# Patient Record
Sex: Male | Born: 1960 | Race: White | Hispanic: No | Marital: Married | Smoking: Current every day smoker
Health system: Southern US, Community
[De-identification: ages and names within clinical notes are randomized; demographics above are authoritative.]

## PROBLEM LIST (undated history)

## (undated) ENCOUNTER — Ambulatory Visit: Admission: EM

## (undated) DIAGNOSIS — J449 Chronic obstructive pulmonary disease, unspecified: Secondary | ICD-10-CM

---

## 2004-04-29 ENCOUNTER — Emergency Department: Payer: Self-pay | Admitting: Emergency Medicine

## 2004-07-11 ENCOUNTER — Emergency Department: Payer: Self-pay | Admitting: Emergency Medicine

## 2008-05-28 ENCOUNTER — Emergency Department: Payer: Self-pay | Admitting: Emergency Medicine

## 2008-07-08 ENCOUNTER — Emergency Department: Payer: Self-pay | Admitting: Emergency Medicine

## 2010-05-14 IMAGING — CR DG CHEST 2V
1 series · 2 of 2 positions shown · non-contrast
Comparison: none

REASON FOR EXAM: cough sob
COMMENTS:

[Series 1: view not recorded · 0.17mm/px · 2 of 2 slices shown]
[im 1/2]
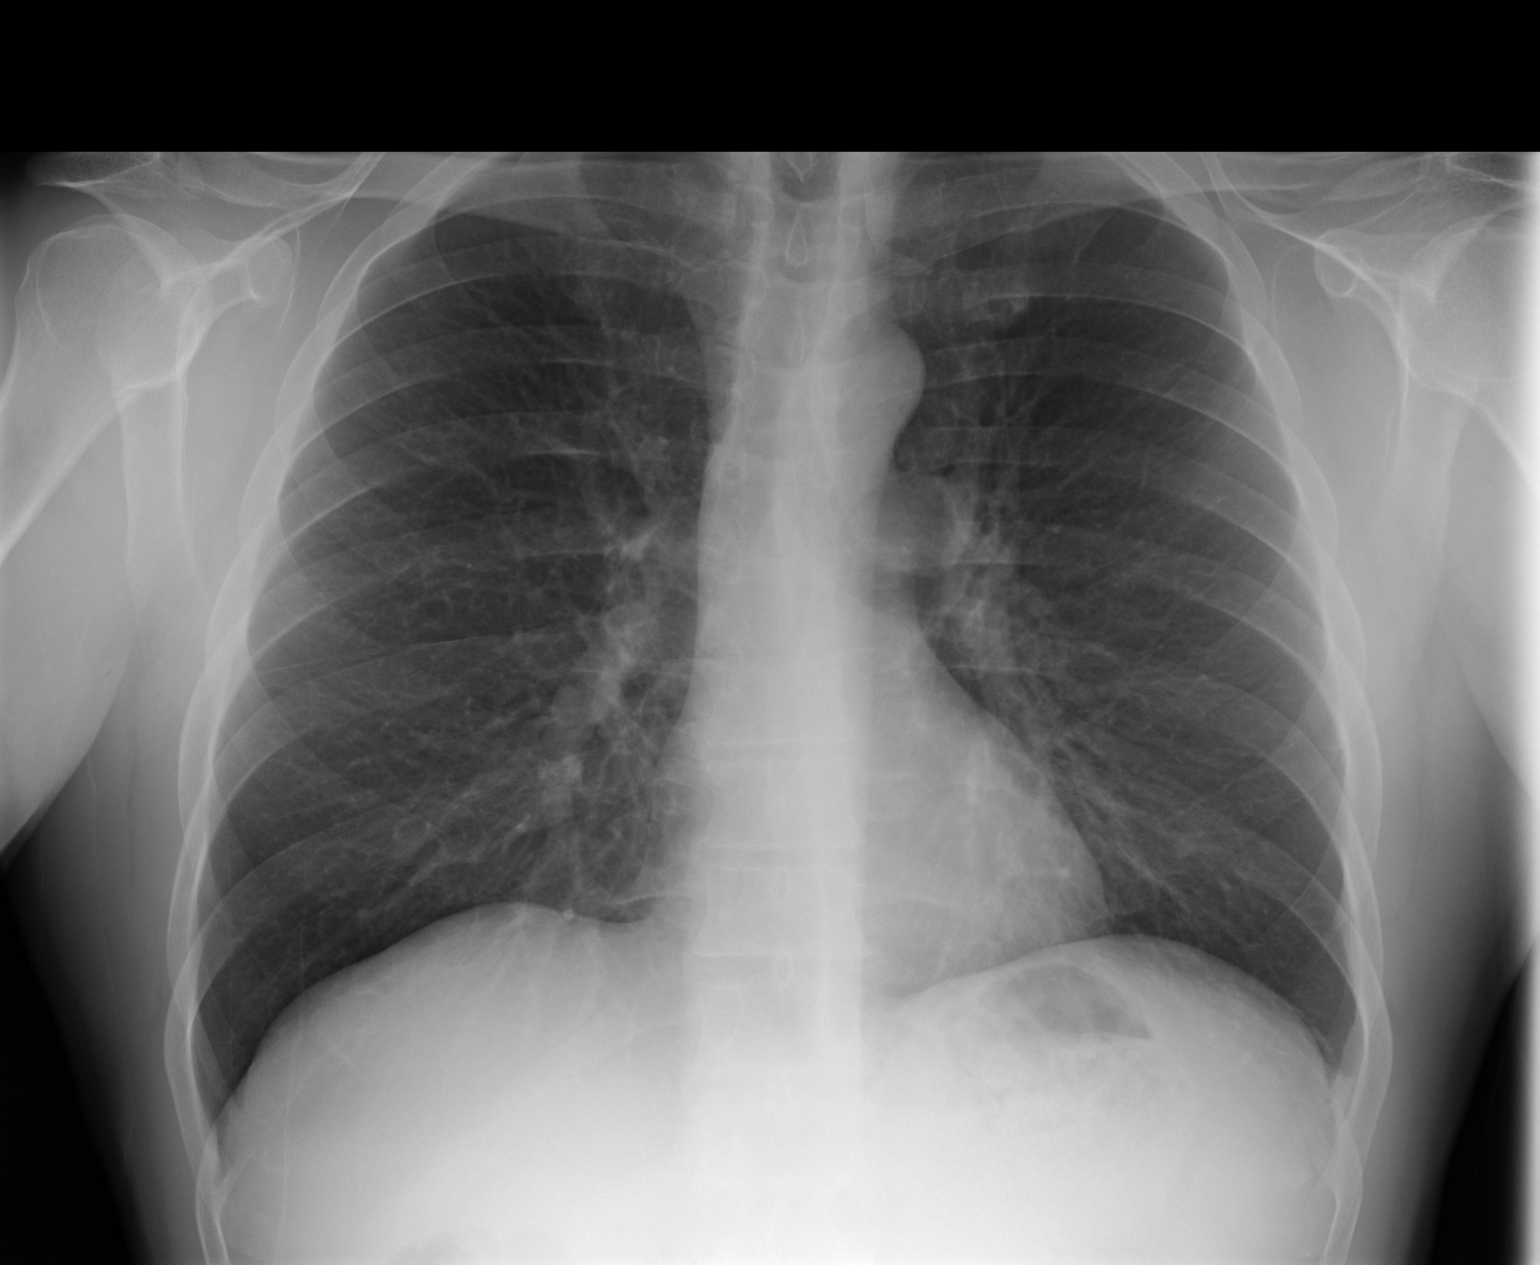
[im 2/2]
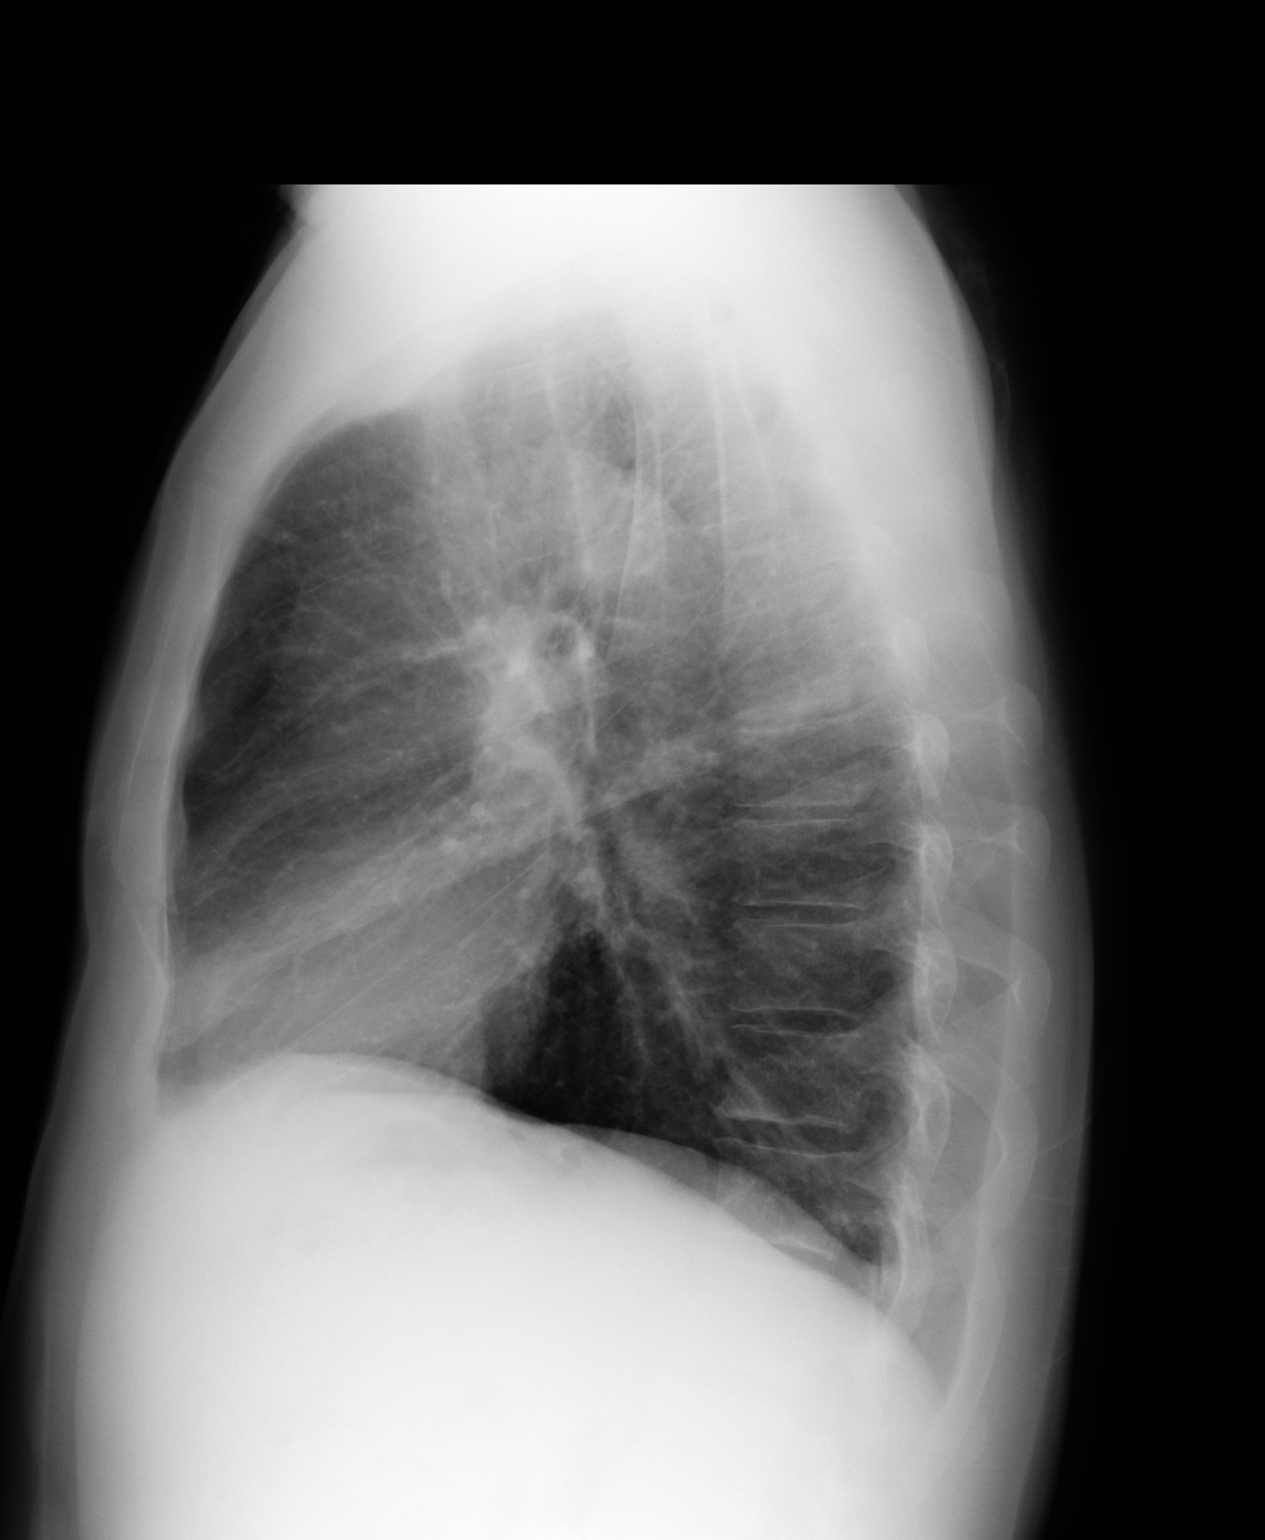

[2 of 2 positions shown; findings below may reference images not displayed]

PROCEDURE:     DXR - DXR CHEST PA (OR AP) AND LATERAL  - May 28, 2008  [DATE]

RESULT:     There is no previous examination for comparison.

The lungs are clear. The heart and pulmonary vessels are normal. The bony
and mediastinal structures are unremarkable. There is no effusion. There is
no pneumothorax or evidence of congestive failure.
IMPRESSION: No acute cardiopulmonary disease.

## 2011-12-14 ENCOUNTER — Emergency Department: Payer: Self-pay | Admitting: Emergency Medicine

## 2020-10-27 ENCOUNTER — Emergency Department
Admission: EM | Admit: 2020-10-27 | Discharge: 2020-10-27 | Disposition: A | Discharge status: Home / Self Care | Payer: Medicare Other | Attending: Emergency Medicine | Admitting: Emergency Medicine

## 2020-10-27 ENCOUNTER — Other Ambulatory Visit: Payer: Self-pay

## 2020-10-27 DIAGNOSIS — R059 Cough, unspecified: Secondary | ICD-10-CM | POA: Insufficient documentation

## 2020-10-27 DIAGNOSIS — Z5321 Procedure and treatment not carried out due to patient leaving prior to being seen by health care provider: Secondary | ICD-10-CM | POA: Diagnosis not present

## 2020-10-27 DIAGNOSIS — R0981 Nasal congestion: Secondary | ICD-10-CM | POA: Diagnosis not present
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
## 2020-10-27 NOTE — ED Triage Notes (Signed)
Pt comes with c/o cough and congestion for couple of days. Pt states productive cough. Pt denies any fevers.

## 2021-01-17 DEATH — deceased

## 2022-02-27 ENCOUNTER — Other Ambulatory Visit: Payer: Self-pay

## 2022-02-27 ENCOUNTER — Emergency Department: Payer: No Typology Code available for payment source

## 2022-02-27 ENCOUNTER — Emergency Department
Admission: EM | Admit: 2022-02-27 | Discharge: 2022-02-27 | Discharge status: Against Medical Advice | Payer: No Typology Code available for payment source | Attending: Family Medicine | Admitting: Family Medicine

## 2022-02-27 DIAGNOSIS — Y92410 Unspecified street and highway as the place of occurrence of the external cause: Secondary | ICD-10-CM | POA: Insufficient documentation

## 2022-02-27 DIAGNOSIS — S0101XA Laceration without foreign body of scalp, initial encounter: Secondary | ICD-10-CM | POA: Diagnosis not present

## 2022-02-27 DIAGNOSIS — Z5321 Procedure and treatment not carried out due to patient leaving prior to being seen by health care provider: Secondary | ICD-10-CM | POA: Insufficient documentation

## 2022-02-27 DIAGNOSIS — S0990XA Unspecified injury of head, initial encounter: Secondary | ICD-10-CM | POA: Diagnosis present
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
## 2022-03-01 ENCOUNTER — Other Ambulatory Visit: Payer: Self-pay

## 2022-03-01 ENCOUNTER — Encounter: Payer: Self-pay | Admitting: Emergency Medicine

## 2022-03-01 ENCOUNTER — Emergency Department
Admission: EM | Admit: 2022-03-01 | Discharge: 2022-03-01 | Disposition: A | Discharge status: Home / Self Care | Payer: Self-pay | Attending: Student in an Organized Health Care Education/Training Program | Admitting: Student in an Organized Health Care Education/Training Program

## 2022-03-01 DIAGNOSIS — S0993XA Unspecified injury of face, initial encounter: Secondary | ICD-10-CM

## 2022-03-01 DIAGNOSIS — M25511 Pain in right shoulder: Secondary | ICD-10-CM | POA: Insufficient documentation

## 2022-03-01 DIAGNOSIS — R42 Dizziness and giddiness: Secondary | ICD-10-CM | POA: Insufficient documentation

## 2022-03-01 DIAGNOSIS — S00502A Unspecified superficial injury of oral cavity, initial encounter: Secondary | ICD-10-CM | POA: Insufficient documentation

## 2022-03-01 DIAGNOSIS — S0990XA Unspecified injury of head, initial encounter: Secondary | ICD-10-CM | POA: Insufficient documentation

## 2022-03-01 DIAGNOSIS — Y92410 Unspecified street and highway as the place of occurrence of the external cause: Secondary | ICD-10-CM | POA: Insufficient documentation

## 2022-03-01 DIAGNOSIS — S0101XA Laceration without foreign body of scalp, initial encounter: Secondary | ICD-10-CM | POA: Insufficient documentation

## 2022-03-01 NOTE — Discharge Instructions (Signed)
Please follow up with the dentist of your choice.  Follow up with primary care for symptoms not improving over the week.  Take Aleve or Tylenol as directed on the packaging.  Return to the ER for symptoms that change or worsen if unable to schedule an appointment.

## 2022-03-01 NOTE — ED Triage Notes (Signed)
Pt via POV from home. Pt states he was seen and left before he got any of his results. Pt here stating he would like his results and he would like [place] to XR his R shoulder, also states that all his teeth are loose. Pt is A&Ox4 and NAD
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
## 2022-05-29 ENCOUNTER — Emergency Department
Admission: EM | Admit: 2022-05-29 | Discharge: 2022-05-29 | Disposition: A | Discharge status: Home / Self Care | Payer: No Typology Code available for payment source | Attending: Emergency Medicine | Admitting: Emergency Medicine

## 2022-05-29 ENCOUNTER — Other Ambulatory Visit: Payer: Self-pay

## 2022-05-29 ENCOUNTER — Emergency Department: Payer: No Typology Code available for payment source

## 2022-05-29 ENCOUNTER — Encounter: Payer: Self-pay | Admitting: Emergency Medicine

## 2022-05-29 DIAGNOSIS — M545 Low back pain, unspecified: Secondary | ICD-10-CM | POA: Insufficient documentation

## 2022-05-29 DIAGNOSIS — M79672 Pain in left foot: Secondary | ICD-10-CM | POA: Insufficient documentation

## 2022-05-29 DIAGNOSIS — M25511 Pain in right shoulder: Secondary | ICD-10-CM | POA: Insufficient documentation

## 2022-05-29 DIAGNOSIS — M25512 Pain in left shoulder: Secondary | ICD-10-CM | POA: Insufficient documentation

## 2022-05-29 DIAGNOSIS — R0789 Other chest pain: Secondary | ICD-10-CM | POA: Insufficient documentation

## 2022-05-29 DIAGNOSIS — Y92410 Unspecified street and highway as the place of occurrence of the external cause: Secondary | ICD-10-CM | POA: Insufficient documentation

## 2022-05-29 DIAGNOSIS — M79671 Pain in right foot: Secondary | ICD-10-CM | POA: Insufficient documentation

## 2022-05-29 MED ORDER — KETOROLAC TROMETHAMINE 15 MG/ML IJ SOLN
15.0000 mg | Freq: Once | INTRAMUSCULAR | Status: AC
  Administered 2022-05-29: 15 mg via INTRAMUSCULAR
  Filled 2022-05-29: qty 1

## 2022-05-29 MED ORDER — ACETAMINOPHEN 325 MG PO TABS
650.0000 mg | ORAL_TABLET | Freq: Once | ORAL | Status: AC
  Administered 2022-05-29: 650 mg via ORAL
  Filled 2022-05-29: qty 2
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
## 2022-05-29 NOTE — Discharge Instructions (Addendum)
Your x-ray is normal.  You may continue to take Tylenol/ibuprofen per package instructions as needed for pain.  Please return for any new, worsening, or change in symptoms or other concerns.  It was a pleasure caring for you today.

## 2022-06-11 ENCOUNTER — Emergency Department
Admission: EM | Admit: 2022-06-11 | Discharge: 2022-06-11 | Disposition: A | Discharge status: Home / Self Care | Payer: No Typology Code available for payment source | Attending: Emergency Medicine | Admitting: Emergency Medicine

## 2022-06-11 ENCOUNTER — Emergency Department: Payer: No Typology Code available for payment source

## 2022-06-11 ENCOUNTER — Other Ambulatory Visit: Payer: Self-pay

## 2022-06-11 ENCOUNTER — Encounter: Payer: Self-pay | Admitting: Emergency Medicine

## 2022-06-11 DIAGNOSIS — M25551 Pain in right hip: Secondary | ICD-10-CM | POA: Diagnosis present

## 2022-06-11 MED ORDER — LIDOCAINE 5 % EX PTCH
1.0000 | MEDICATED_PATCH | CUTANEOUS | Status: DC
  Administered 2022-06-11: 1 via TRANSDERMAL
  Filled 2022-06-11: qty 1

## 2022-06-11 MED ORDER — HYDROCODONE-ACETAMINOPHEN 5-325 MG PO TABS: 1.0000 | ORAL_TABLET | Freq: Four times a day (QID) | ORAL | 0 refills | Status: DC | PRN

## 2022-06-11 MED ORDER — LIDOCAINE 5 % EX PTCH: 1.0000 | MEDICATED_PATCH | Freq: Two times a day (BID) | CUTANEOUS | 0 refills | Status: AC

## 2022-06-11 MED ORDER — METHYLPREDNISOLONE SODIUM SUCC 125 MG IJ SOLR
80.0000 mg | Freq: Once | INTRAMUSCULAR | Status: AC
  Administered 2022-06-11: 80 mg via INTRAMUSCULAR
  Filled 2022-06-11: qty 2

## 2022-06-11 MED ORDER — HYDROCODONE-ACETAMINOPHEN 5-325 MG PO TABS
1.0000 | ORAL_TABLET | Freq: Once | ORAL | Status: AC
  Administered 2022-06-11: 1 via ORAL
  Filled 2022-06-11: qty 1
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
## 2022-06-11 NOTE — Discharge Instructions (Addendum)
Keep your appointment with your orthopedist this coming week.  A prescription for hydrocodone was sent to the pharmacy to take every 6 hours as needed for pain.  Be aware that she cannot drive or operate machinery while taking this medication as it could cause drowsiness.  Also a Lidoderm patch was applied to your hip while in the emergency department and a prescription for the same was sent to the pharmacy.
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
## 2022-06-15 ENCOUNTER — Encounter: Payer: Self-pay | Admitting: Emergency Medicine

## 2022-06-15 ENCOUNTER — Emergency Department
Admission: EM | Admit: 2022-06-15 | Discharge: 2022-06-15 | Disposition: A | Discharge status: Home / Self Care | Payer: Self-pay | Attending: Emergency Medicine | Admitting: Emergency Medicine

## 2022-06-15 DIAGNOSIS — R209 Unspecified disturbances of skin sensation: Secondary | ICD-10-CM | POA: Insufficient documentation

## 2022-06-15 DIAGNOSIS — G5711 Meralgia paresthetica, right lower limb: Secondary | ICD-10-CM | POA: Diagnosis not present

## 2022-06-15 DIAGNOSIS — M25551 Pain in right hip: Secondary | ICD-10-CM | POA: Insufficient documentation

## 2022-06-15 MED ORDER — OXYCODONE-ACETAMINOPHEN 5-325 MG PO TABS
2.0000 | ORAL_TABLET | Freq: Once | ORAL | Status: AC
  Administered 2022-06-15: 2 via ORAL
  Filled 2022-06-15: qty 2

## 2022-06-15 MED ORDER — IBUPROFEN 800 MG PO TABS: 800.0000 mg | ORAL_TABLET | Freq: Three times a day (TID) | ORAL | 0 refills | Status: DC | PRN

## 2022-06-15 MED ORDER — OXYCODONE-ACETAMINOPHEN 5-325 MG PO TABS: 2.0000 | ORAL_TABLET | Freq: Four times a day (QID) | ORAL | 0 refills | Status: DC | PRN

## 2022-06-15 MED ORDER — ONDANSETRON 4 MG PO TBDP: 4.0000 mg | ORAL_TABLET | Freq: Four times a day (QID) | ORAL | 0 refills | Status: DC | PRN

## 2022-06-15 MED ORDER — PREDNISONE 20 MG PO TABS
60.0000 mg | ORAL_TABLET | Freq: Once | ORAL | Status: AC
  Administered 2022-06-15: 60 mg via ORAL
  Filled 2022-06-15: qty 3

## 2022-06-15 MED ORDER — IBUPROFEN 800 MG PO TABS
800.0000 mg | ORAL_TABLET | Freq: Once | ORAL | Status: AC
  Administered 2022-06-15: 800 mg via ORAL
  Filled 2022-06-15: qty 1

## 2022-06-15 MED ORDER — PREDNISONE 10 MG (21) PO TBPK: ORAL_TABLET | ORAL | 0 refills | Status: DC

## 2022-06-15 MED ORDER — ONDANSETRON 4 MG PO TBDP
4.0000 mg | ORAL_TABLET | Freq: Once | ORAL | Status: AC
  Administered 2022-06-15: 4 mg via ORAL
  Filled 2022-06-15: qty 1
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
## 2022-06-15 NOTE — ED Notes (Signed)
DC instructions given verbally and in writing, understanding voiced, unable to obtain signature d/t pad not working.  Pt left ambualtory in stable condition

## 2022-06-15 NOTE — Discharge Instructions (Addendum)
Please follow-up with orthopedics as scheduled on Wednesday.   You are being provided a prescription for opiates (also known as narcotics) for pain control.  Opiates can be addictive and should only be used when absolutely necessary for pain control when other alternatives do not work.  We recommend you only use them for the recommended amount of time and only as prescribed.  Please do not take with other sedative medications or alcohol.  Please do not drive, operate machinery, make important decisions while taking opiates.  Please note that these medications can be addictive and have high abuse potential.  Patients can become addicted to narcotics after only taking them for a few days.  Please keep these medications locked away from children, teenagers or any family members with history of substance abuse.  Narcotic pain medicine may also make you constipated.  You may use over-the-counter medications such as MiraLAX, Colace to prevent constipation.  If you become constipated, you may use over-the-counter enemas as needed.  Itching and nausea are also common side effects of narcotic pain medication.  If you develop uncontrolled vomiting or a rash, please stop these medications and seek medical care.
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
## 2023-12-18 ENCOUNTER — Other Ambulatory Visit: Payer: Self-pay

## 2023-12-18 ENCOUNTER — Emergency Department
Admission: EM | Admit: 2023-12-18 | Discharge: 2023-12-18 | Disposition: A | Discharge status: Home / Self Care | Attending: Emergency Medicine | Admitting: Emergency Medicine

## 2023-12-18 DIAGNOSIS — M791 Myalgia, unspecified site: Secondary | ICD-10-CM | POA: Insufficient documentation

## 2023-12-18 DIAGNOSIS — R1030 Lower abdominal pain, unspecified: Secondary | ICD-10-CM | POA: Diagnosis present

## 2023-12-18 LAB — URINALYSIS, ROUTINE W REFLEX MICROSCOPIC
Bilirubin Urine: NEGATIVE
Glucose, UA: NEGATIVE mg/dL
Hgb urine dipstick: NEGATIVE
Ketones, ur: NEGATIVE mg/dL
Leukocytes,Ua: NEGATIVE
Nitrite: NEGATIVE
Protein, ur: NEGATIVE mg/dL
Specific Gravity, Urine: 1.02 (ref 1.005–1.030)
pH: 5 (ref 5.0–8.0)

## 2023-12-18 MED ORDER — GABAPENTIN 300 MG PO CAPS: 300.0000 mg | ORAL_CAPSULE | Freq: Three times a day (TID) | ORAL | 0 refills | Status: AC | PRN

## 2023-12-18 MED ORDER — NAPROXEN 500 MG PO TABS: 500.0000 mg | ORAL_TABLET | Freq: Two times a day (BID) | ORAL | 0 refills | Status: DC | PRN
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
## 2023-12-18 NOTE — ED Triage Notes (Signed)
 Pt presents to the ER from home with complaints of severe pain to right groin. Pt reports standing and walking increases pain. Pt reports tenderness to right thigh as well. Pt talks in complete sentences no respiratory distress noted. Pt reports pain for the past 3 days. Taking ibuprofen  at home for pain. Pt talks in complete sentences no distress noted

## 2024-07-25 ENCOUNTER — Encounter: Payer: Self-pay | Admitting: Emergency Medicine

## 2024-07-25 ENCOUNTER — Observation Stay
Admission: EM | Admit: 2024-07-25 | Discharge: 2024-07-26 | Disposition: A | Discharge status: Home / Self Care | Attending: Osteopathic Medicine | Admitting: Osteopathic Medicine

## 2024-07-25 ENCOUNTER — Other Ambulatory Visit: Payer: Self-pay

## 2024-07-25 ENCOUNTER — Emergency Department

## 2024-07-25 DIAGNOSIS — F17210 Nicotine dependence, cigarettes, uncomplicated: Secondary | ICD-10-CM | POA: Diagnosis not present

## 2024-07-25 DIAGNOSIS — F17200 Nicotine dependence, unspecified, uncomplicated: Secondary | ICD-10-CM | POA: Diagnosis present

## 2024-07-25 DIAGNOSIS — J441 Chronic obstructive pulmonary disease with (acute) exacerbation: Principal | ICD-10-CM | POA: Insufficient documentation

## 2024-07-25 DIAGNOSIS — J9601 Acute respiratory failure with hypoxia: Secondary | ICD-10-CM | POA: Insufficient documentation

## 2024-07-25 DIAGNOSIS — R0602 Shortness of breath: Secondary | ICD-10-CM | POA: Diagnosis present

## 2024-07-25 DIAGNOSIS — F419 Anxiety disorder, unspecified: Secondary | ICD-10-CM | POA: Insufficient documentation

## 2024-07-25 HISTORY — DX: Chronic obstructive pulmonary disease, unspecified: J44.9

## 2024-07-25 LAB — BASIC METABOLIC PANEL WITH GFR
Anion gap: 12 (ref 5–15)
BUN: 13 mg/dL (ref 8–23)
CO2: 26 mmol/L (ref 22–32)
Calcium: 9.8 mg/dL (ref 8.9–10.3)
Chloride: 103 mmol/L (ref 98–111)
Creatinine, Ser: 1.05 mg/dL (ref 0.61–1.24)
GFR, Estimated: >60 mL/min
Glucose, Bld: 113 mg/dL — ABNORMAL HIGH (ref 70–99)
Potassium: 4.1 mmol/L (ref 3.5–5.1)
Sodium: 141 mmol/L (ref 135–145)

## 2024-07-25 LAB — RESP PANEL BY RT-PCR (RSV, FLU A&B, COVID)  RVPGX2
Influenza A by PCR: NEGATIVE
Influenza B by PCR: NEGATIVE
Resp Syncytial Virus by PCR: NEGATIVE
SARS Coronavirus 2 by RT PCR: NEGATIVE

## 2024-07-25 LAB — CBC WITH DIFFERENTIAL/PLATELET
Abs Immature Granulocytes: 0.02 K/uL (ref 0.00–0.07)
Basophils Absolute: 0.1 K/uL (ref 0.0–0.1)
Basophils Relative: 1 %
Eosinophils Absolute: 0.9 K/uL — ABNORMAL HIGH (ref 0.0–0.5)
Eosinophils Relative: 12 %
HCT: 52.1 % — ABNORMAL HIGH (ref 39.0–52.0)
Hemoglobin: 17.6 g/dL — ABNORMAL HIGH (ref 13.0–17.0)
Immature Granulocytes: 0 %
Lymphocytes Relative: 22 %
Lymphs Abs: 1.6 K/uL (ref 0.7–4.0)
MCH: 31.5 pg (ref 26.0–34.0)
MCHC: 33.8 g/dL (ref 30.0–36.0)
MCV: 93.4 fL (ref 80.0–100.0)
Monocytes Absolute: 0.7 K/uL (ref 0.1–1.0)
Monocytes Relative: 10 %
Neutro Abs: 4 K/uL (ref 1.7–7.7)
Neutrophils Relative %: 55 %
Platelets: 280 K/uL (ref 150–400)
RBC: 5.58 MIL/uL (ref 4.22–5.81)
RDW: 13.1 % (ref 11.5–15.5)
WBC: 7.2 K/uL (ref 4.0–10.5)
nRBC: 0 % (ref 0.0–0.2)

## 2024-07-25 MED ORDER — ONDANSETRON HCL 4 MG PO TABS: 4.0000 mg | ORAL_TABLET | Freq: Four times a day (QID) | ORAL | Status: DC | PRN

## 2024-07-25 MED ORDER — PREDNISONE 20 MG PO TABS
40.0000 mg | ORAL_TABLET | Freq: Every day | ORAL | Status: DC
  Administered 2024-07-26: 40 mg via ORAL
  Filled 2024-07-25: qty 2

## 2024-07-25 MED ORDER — BUPRENORPHINE HCL-NALOXONE HCL 8-2 MG SL SUBL
1.0000 | SUBLINGUAL_TABLET | Freq: Once | SUBLINGUAL | Status: AC
  Administered 2024-07-25: 1 via SUBLINGUAL
  Filled 2024-07-25: qty 1

## 2024-07-25 MED ORDER — ENOXAPARIN SODIUM 40 MG/0.4ML IJ SOSY
40.0000 mg | PREFILLED_SYRINGE | INTRAMUSCULAR | Status: DC
  Administered 2024-07-25 – 2024-07-26 (×2): 40 mg via SUBCUTANEOUS
  Filled 2024-07-25 (×2): qty 0.4

## 2024-07-25 MED ORDER — METHYLPREDNISOLONE SODIUM SUCC 125 MG IJ SOLR
125.0000 mg | Freq: Once | INTRAMUSCULAR | Status: AC
  Administered 2024-07-25: 125 mg via INTRAVENOUS
  Filled 2024-07-25: qty 2

## 2024-07-25 MED ORDER — PANTOPRAZOLE SODIUM 40 MG PO TBEC
40.0000 mg | DELAYED_RELEASE_TABLET | Freq: Two times a day (BID) | ORAL | Status: DC
  Administered 2024-07-25 – 2024-07-26 (×2): 40 mg via ORAL
  Filled 2024-07-25 (×2): qty 1

## 2024-07-25 MED ORDER — NICOTINE 14 MG/24HR TD PT24
14.0000 mg | MEDICATED_PATCH | Freq: Every day | TRANSDERMAL | Status: DC
  Administered 2024-07-25 – 2024-07-26 (×2): 14 mg via TRANSDERMAL
  Filled 2024-07-25 (×2): qty 1

## 2024-07-25 MED ORDER — LORAZEPAM 1 MG PO TABS
1.0000 mg | ORAL_TABLET | Freq: Every day | ORAL | Status: DC
  Administered 2024-07-25: 1 mg via ORAL
  Filled 2024-07-25: qty 1

## 2024-07-25 MED ORDER — GUAIFENESIN-DM 100-10 MG/5ML PO SYRP
10.0000 mL | ORAL_SOLUTION | ORAL | Status: DC | PRN
  Administered 2024-07-25 – 2024-07-26 (×2): 10 mL via ORAL
  Filled 2024-07-25 (×2): qty 10

## 2024-07-25 MED ORDER — ACETAMINOPHEN 325 MG PO TABS: 650.0000 mg | ORAL_TABLET | Freq: Four times a day (QID) | ORAL | Status: DC | PRN

## 2024-07-25 MED ORDER — ONDANSETRON HCL 4 MG/2ML IJ SOLN: 4.0000 mg | Freq: Four times a day (QID) | INTRAMUSCULAR | Status: DC | PRN

## 2024-07-25 MED ORDER — HYDROCODONE-ACETAMINOPHEN 5-325 MG PO TABS: 1.0000 | ORAL_TABLET | ORAL | Status: DC | PRN

## 2024-07-25 MED ORDER — IPRATROPIUM-ALBUTEROL 0.5-2.5 (3) MG/3ML IN SOLN
3.0000 mL | Freq: Four times a day (QID) | RESPIRATORY_TRACT | Status: DC
  Administered 2024-07-25 (×3): 3 mL via RESPIRATORY_TRACT
  Filled 2024-07-25 (×3): qty 3

## 2024-07-25 MED ORDER — IPRATROPIUM-ALBUTEROL 0.5-2.5 (3) MG/3ML IN SOLN
3.0000 mL | Freq: Three times a day (TID) | RESPIRATORY_TRACT | Status: DC
  Administered 2024-07-26: 3 mL via RESPIRATORY_TRACT
  Filled 2024-07-25: qty 3

## 2024-07-25 MED ORDER — ACETAMINOPHEN 650 MG RE SUPP: 650.0000 mg | Freq: Four times a day (QID) | RECTAL | Status: DC | PRN

## 2024-07-25 MED ORDER — LORAZEPAM 1 MG PO TABS
1.0000 mg | ORAL_TABLET | Freq: Once | ORAL | Status: AC
  Administered 2024-07-25: 1 mg via ORAL
  Filled 2024-07-25: qty 1

## 2024-07-25 MED ORDER — IPRATROPIUM-ALBUTEROL 0.5-2.5 (3) MG/3ML IN SOLN
3.0000 mL | RESPIRATORY_TRACT | Status: AC
  Administered 2024-07-25 (×3): 3 mL via RESPIRATORY_TRACT
  Filled 2024-07-25: qty 9

## 2024-07-25 MED ORDER — METHYLPREDNISOLONE SODIUM SUCC 40 MG IJ SOLR
40.0000 mg | Freq: Two times a day (BID) | INTRAMUSCULAR | Status: AC
  Administered 2024-07-25 (×2): 40 mg via INTRAVENOUS
  Filled 2024-07-25 (×2): qty 1

## 2024-07-25 MED ORDER — SODIUM CHLORIDE 0.9 % IV SOLN
100.0000 mg | Freq: Two times a day (BID) | INTRAVENOUS | Status: DC
  Administered 2024-07-25 (×2): 100 mg via INTRAVENOUS
  Filled 2024-07-25 (×3): qty 100

## 2024-07-25 MED ORDER — GUAIFENESIN ER 600 MG PO TB12
600.0000 mg | ORAL_TABLET | Freq: Two times a day (BID) | ORAL | Status: DC
  Administered 2024-07-25 – 2024-07-26 (×3): 600 mg via ORAL
  Filled 2024-07-25 (×3): qty 1

## 2024-07-25 MED ORDER — ALBUTEROL SULFATE (2.5 MG/3ML) 0.083% IN NEBU: 2.5000 mg | INHALATION_SOLUTION | RESPIRATORY_TRACT | Status: DC | PRN

## 2024-07-25 NOTE — ED Notes (Addendum)
 Pt restless. Will not sit in bed. Standing at bedside at this time. Breathing appears to be more regular and less labored.

## 2024-07-25 NOTE — Plan of Care (Signed)

## 2024-07-25 NOTE — ED Notes (Signed)
 Called CCMD to add pt to monitoring.

## 2024-07-25 NOTE — ED Provider Notes (Signed)
 "  [facility] Provider Note    Event Date/Time   First MD Initiated Contact with Patient 07/25/24 [phone]     (approximate)   History   Shortness of Breath   HPI  Roy Diaz is a 64 y.o. male with history of chronic back pain, COPD not on home oxygen who presents to the emergency department several days of cough, congestion, wheezing.  No fevers, chest pain.  No lower extremity swelling or pain.  No history of CHF, PE or DVT.  No known sick contacts.  Significant other at bedside reports pulse ox has been in the upper 80s.  He does not wear oxygen chronically.   History provided by patient, wife.    Past Medical History:  Diagnosis Date   COPD (chronic obstructive pulmonary disease) (HCC)     History reviewed. No pertinent surgical history.  MEDICATIONS:  Prior to Admission medications  Medication Sig Start Date End Date Taking? Authorizing Provider  gabapentin  (NEURONTIN ) 300 MG capsule Take 1 capsule (300 mg total) by mouth 3 (three) times daily as needed. 12/18/23 12/17/24  Floy Roberts, MD  ibuprofen  (ADVIL ) 800 MG tablet Take 1 tablet (800 mg total) by mouth every 8 (eight) hours as needed. 06/15/22   Abdulwahab Demelo, Josette SAILOR, DO  naproxen  (NAPROSYN ) 500 MG tablet Take 1 tablet (500 mg total) by mouth 2 (two) times daily as needed for moderate pain (pain score 4-6). 12/18/23 12/17/24  Floy Roberts, MD  ondansetron  (ZOFRAN -ODT) 4 MG disintegrating tablet Take 1 tablet (4 mg total) by mouth every 6 (six) hours as needed for nausea or vomiting. 06/15/22   Bentlie Withem, Josette SAILOR, DO  oxyCODONE -acetaminophen  (PERCOCET/ROXICET) 5-325 MG tablet Take 2 tablets by mouth every 6 (six) hours as needed. 06/15/22   Hattye Siegfried, Josette SAILOR, DO  predniSONE  (STERAPRED UNI-PAK 21 TAB) 10 MG (21) TBPK tablet Take as directed 06/15/22   Raelynn Corron, Josette SAILOR, DO    Physical Exam   Triage Vital Signs: ED Triage Vitals  Encounter Vitals Group     BP 07/25/24 0245 (!) 153/80      Girls Systolic BP Percentile --      Girls Diastolic BP Percentile --      Boys Systolic BP Percentile --      Boys Diastolic BP Percentile --      Pulse Rate 07/25/24 0245 82     Resp 07/25/24 0245 (!) 36     Temp 07/25/24 0245 (!) 97.4 F (36.3 C)     Temp Source 07/25/24 0245 Oral     SpO2 07/25/24 0245 (!) 85 %     Weight 07/25/24 0246 172 lb (78 kg)     Height 07/25/24 0246 5' 7 (1.702 m)     Head Circumference --      Peak Flow --      Pain Score 07/25/24 0246 0     Pain Loc --      Pain Education --      Exclude from Growth Chart --     Most recent vital signs: Vitals:   07/25/24 0600 07/25/24 0651  BP: 126/69   Pulse: 60   Resp: 10   Temp:  98 F (36.7 C)  SpO2: 93%     CONSTITUTIONAL: Alert, responds appropriately to questions. Well-appearing; well-nourished HEAD: Normocephalic, atraumatic EYES: Conjunctivae clear, pupils appear equal, sclera nonicteric ENT: normal nose; moist mucous membranes NECK: Supple, normal ROM CARD: RRR; S1 and S2 appreciated RESP: Slight increased work of breathing  and tachypnea.  Sats 85% on room air.  Sats 92% on 4 L.  Diffuse inspiratory and x-ray wheezes.  Diminished aeration at bases bilaterally.  No distress.  Speaking full sentences. ABD/GI: Non-distended; soft, non-tender, no rebound, no guarding, no peritoneal signs BACK: The back appears normal EXT: Normal ROM in all joints; no deformity noted, no edema SKIN: Normal color for age and race; warm; no rash on exposed skin NEURO: Moves all extremities equally, normal speech PSYCH: The patient's mood and manner are appropriate.   ED Results / Procedures / Treatments   LABS: (all labs ordered are listed, but only abnormal results are displayed) Labs Reviewed  CBC WITH DIFFERENTIAL/PLATELET - Abnormal; Notable for the following components:      Result Value   Hemoglobin 17.6 (*)    HCT 52.1 (*)    Eosinophils Absolute 0.9 (*)    All other components within normal limits   BASIC METABOLIC PANEL WITH GFR - Abnormal; Notable for the following components:   Glucose, Bld 113 (*)    All other components within normal limits  RESP PANEL BY RT-PCR (RSV, FLU A&B, COVID)  RVPGX2  HIV ANTIBODY (ROUTINE TESTING W REFLEX)     EKG:  EKG Interpretation Date/Time:  Tuesday July 25 2024 02:49:28 EST Ventricular Rate:  82 PR Interval:  134 QRS Duration:  94 QT Interval:  382 QTC Calculation: 446 R Axis:   65  Text Interpretation: Normal sinus rhythm Cannot rule out Anterior infarct , age undetermined Abnormal ECG No previous ECGs available Confirmed by Neomi Neptune [phone]) on 07/25/2024 2:55:34 AM         RADIOLOGY: My personal review and interpretation of imaging: Chest x-ray clear.  I have personally reviewed all radiology reports.   DG Chest Portable 1 View Result Date: 07/25/2024 EXAM: 1 VIEW(S) XRAY OF THE CHEST 07/25/2024 03:44:36 AM COMPARISON: 05/29/2022 CLINICAL HISTORY: Short of breath, new oxygen requirement. FINDINGS: LUNGS AND PLEURA: No focal pulmonary opacity. No pleural effusion. No pneumothorax. HEART AND MEDIASTINUM: No acute abnormality of the cardiac and mediastinal silhouettes. BONES AND SOFT TISSUES: No acute osseous abnormality. IMPRESSION: 1. No active cardiopulmonary disease. Electronically signed by: Franky Crease MD 07/25/2024 03:50 AM EST RP Workstation: [id number]     PROCEDURES:  Critical Care performed: Yes, see critical care procedure note(s)   CRITICAL CARE Performed by: Neptune Neomi   Total critical care time: 30 minutes  Critical care time was exclusive of separately billable procedures and treating other patients.  Critical care was necessary to treat or prevent imminent or life-threatening deterioration.  Critical care was time spent personally by me on the following activities: development of treatment plan with patient and/or surrogate as well as nursing, discussions with consultants, evaluation of patient's  response to treatment, examination of patient, obtaining history from patient or surrogate, ordering and performing treatments and interventions, ordering and review of laboratory studies, ordering and review of radiographic studies, pulse oximetry and re-evaluation of patient's condition.   [name]1-3 Lead EKG Interpretation  Performed by: Jaedyn Marrufo, Neptune SAILOR, DO Authorized by: Frankye Schwegel, Neptune SAILOR, DO     Interpretation: normal     ECG rate:  60   ECG rate assessment: normal     Rhythm: sinus rhythm     Ectopy: none     Conduction: normal       IMPRESSION / MDM / ASSESSMENT AND PLAN / ED COURSE  I reviewed the triage vital signs and the nursing notes.    Patient here for  shortness of breath, hypoxia.  History of COPD.  Still smokes.  The patient is on the cardiac monitor to evaluate for evidence of arrhythmia and/or significant heart rate changes.   DIFFERENTIAL DIAGNOSIS (includes but not limited to):   COPD exacerbation, less likely CHF, pneumonia, viral URI, PE, ACS   Patient's presentation is most consistent with acute presentation with potential threat to life or bodily function.   PLAN: Will obtain labs, chest x-ray.  EKG nonischemic.  Will give IV Solu-Medrol , breathing treatments.  Patient will need admission.   MEDICATIONS GIVEN IN ED: Medications  guaiFENesin -dextromethorphan  (ROBITUSSIN DM) 100-10 MG/5ML syrup 10 mL (has no administration in time range)  enoxaparin  (LOVENOX ) injection 40 mg (has no administration in time range)  acetaminophen  (TYLENOL ) tablet 650 mg (has no administration in time range)    Or  acetaminophen  (TYLENOL ) suppository 650 mg (has no administration in time range)  ondansetron  (ZOFRAN ) tablet 4 mg (has no administration in time range)    Or  ondansetron  (ZOFRAN ) injection 4 mg (has no administration in time range)  methylPREDNISolone  sodium succinate (SOLU-MEDROL ) 40 mg/mL injection 40 mg (has no administration in time range)    Followed by   predniSONE  (DELTASONE ) tablet 40 mg (has no administration in time range)  ipratropium-albuterol  (DUONEB) 0.5-2.5 (3) MG/3ML nebulizer solution 3 mL (has no administration in time range)  albuterol  (PROVENTIL ) (2.5 MG/3ML) 0.083% nebulizer solution 2.5 mg (has no administration in time range)  HYDROcodone -acetaminophen  (NORCO/VICODIN) 5-325 MG per tablet 1-2 tablet (has no administration in time range)  guaiFENesin  (MUCINEX ) 12 hr tablet 600 mg (has no administration in time range)  ipratropium-albuterol  (DUONEB) 0.5-2.5 (3) MG/3ML nebulizer solution 3 mL (3 mLs Nebulization Given 07/25/24 0315)  methylPREDNISolone  sodium succinate (SOLU-MEDROL ) 125 mg/2 mL injection 125 mg (125 mg Intravenous Given 07/25/24 0313)  buprenorphine -naloxone  (SUBOXONE ) 8-2 mg per SL tablet 1 tablet (1 tablet Sublingual Given 07/25/24 0407)     ED COURSE: Labs show no leukocytosis.  Normal electrolytes.  Chest x-ray reviewed and interpreted by myself the radiologist and is clear.  COVID, flu and RSV negative.  Will discuss with hospitalist for admission given new oxygen requirement.   CONSULTS:  Consulted and discussed patient's case with hospitalist, Dr. Cleatus.  I have recommended admission and consulting physician agrees and will place admission orders.  Patient (and family if present) agree with this plan.   I reviewed all nursing notes, vitals, pertinent previous records.  All labs, EKGs, imaging ordered have been independently reviewed and interpreted by myself.    OUTSIDE RECORDS REVIEWED: Reviewed last pulmonology notes.       FINAL CLINICAL IMPRESSION(S) / ED DIAGNOSES   Final diagnoses:  COPD exacerbation (HCC)  Acute respiratory failure with hypoxia (HCC)     Rx / DC Orders   ED Discharge Orders     None        Note:  This document was prepared using Dragon voice recognition software and may include unintentional dictation errors.   Lavalle Skoda, Josette SAILOR, DO 07/25/24 [phone]  "

## 2024-07-25 NOTE — H&P (Signed)
 "  History and Physical    Roy Diaz FMW:[id number] DOB: 19-Sep-1960 DOA: 07/25/2024  DOS: the patient was seen and examined on 07/25/2024  PCP: Patient, No Pcp Per   Patient coming from: Home  I have personally briefly reviewed patient's old medical records in [facility]  Chief Complaint: Shortness of breath for a week.  HPI: Roy Diaz is a pleasant 64 y.o. male with medical history significant for COPD not on home oxygen, chronic ongoing smoking who came into ED complaining of cough congestion and wheezing for a week duration.  Patient stated that for the last 1 week he is gradually getting worsening shortness of breath with cough no sputum production.  He was congested and wheezing.  He denies any lower extremity swelling.  He denies any orthopnea or PND.  He does not have a history of CHF DVT or PE.  Denies any recent sick contacts.  Denies any fever, chills, nausea, vomiting, chest pain, palpitations.  ED Course: Upon arrival to the ED, patient is found to be hypoxic at 85% on room air, tachypneic at 36, COVID RSV and flu were negative.  Chest x-ray showed no acute cardiopulmonary disease, EKG was sinus rhythm.  Patient was given nebulization, steroid and oxygen.  Hospitalist service was consulted for evaluation for admission for COPD exacerbation.  Review of Systems:  ROS  All other systems negative except as noted in the HPI.  Past Medical History:  Diagnosis Date   COPD (chronic obstructive pulmonary disease) (HCC)     History reviewed. No pertinent surgical history.   reports that he has been smoking cigarettes. He has never used smokeless tobacco. He reports that he does not drink alcohol and does not use drugs.  Allergies[1]  History reviewed. No pertinent family history.  Prior to Admission medications  Medication Sig Start Date End Date Taking? Authorizing Provider  gabapentin  (NEURONTIN ) 300 MG capsule Take 1 capsule (300 mg total) by mouth 3  (three) times daily as needed. 12/18/23 12/17/24  Floy Roberts, MD  ibuprofen  (ADVIL ) 800 MG tablet Take 1 tablet (800 mg total) by mouth every 8 (eight) hours as needed. 06/15/22   Ward, Josette SAILOR, DO  naproxen  (NAPROSYN ) 500 MG tablet Take 1 tablet (500 mg total) by mouth 2 (two) times daily as needed for moderate pain (pain score 4-6). 12/18/23 12/17/24  Floy Roberts, MD  ondansetron  (ZOFRAN -ODT) 4 MG disintegrating tablet Take 1 tablet (4 mg total) by mouth every 6 (six) hours as needed for nausea or vomiting. 06/15/22   Ward, Josette SAILOR, DO  oxyCODONE -acetaminophen  (PERCOCET/ROXICET) 5-325 MG tablet Take 2 tablets by mouth every 6 (six) hours as needed. 06/15/22   Ward, Josette SAILOR, DO  predniSONE  (STERAPRED UNI-PAK 21 TAB) 10 MG (21) TBPK tablet Take as directed 06/15/22   Ward, Josette SAILOR, DO    Physical Exam: Vitals:   07/25/24 0651 07/25/24 0730 07/25/24 0745 07/25/24 0844  BP:  111/70    Pulse:  71 77 68  Resp:  13 14 19   Temp: 98 F (36.7 C)     TempSrc: Oral     SpO2:  95% 96% 94%  Weight:      Height:        Physical Exam   Constitutional: Alert, awake, calm, comfortable HEENT: Neck supple Respiratory: Bilateral extensive wheezes.  No rales, scattered rhonchi present Cardiovascular: Regular rate and rhythm, no murmurs / rubs / gallops. No extremity edema. 2+ pedal pulses. No carotid bruits.  Abdomen:  Soft, no tenderness, Bowel sounds positive.  Musculoskeletal: no clubbing / cyanosis. Good ROM, no contractures. Normal muscle tone.  Skin: no rashes, lesions, ulcers. Neurologic: CN 2-12 grossly intact. Sensation intact, No focal deficit identified Psychiatric: Alert and oriented x 3. Normal mood.    Labs on Admission: I have personally reviewed following labs and imaging studies  CBC: Recent Labs  Lab 07/25/24 0303  WBC 7.2  NEUTROABS 4.0  HGB 17.6*  HCT 52.1*  MCV 93.4  PLT 280   Basic Metabolic Panel: Recent Labs  Lab 07/25/24 0303  NA 141  K 4.1  CL  103  CO2 26  GLUCOSE 113*  BUN 13  CREATININE 1.05  CALCIUM 9.8   GFR: Estimated Creatinine Clearance: 67.3 mL/min (by C-G formula based on SCr of 1.05 mg/dL). Liver Function Tests: No results for input(s): AST, ALT, ALKPHOS, BILITOT, PROT, ALBUMIN in the last 168 hours. No results for input(s): LIPASE, AMYLASE in the last 168 hours. No results for input(s): AMMONIA in the last 168 hours. Coagulation Profile: No results for input(s): INR, PROTIME in the last 168 hours. Cardiac Enzymes: No results for input(s): CKTOTAL, CKMB, CKMBINDEX, TROPONINI, TROPONINIHS in the last 168 hours. BNP (last 3 results) No results for input(s): BNP in the last 8760 hours. HbA1C: No results for input(s): HGBA1C in the last 72 hours. CBG: No results for input(s): GLUCAP in the last 168 hours. Lipid Profile: No results for input(s): CHOL, HDL, LDLCALC, TRIG, CHOLHDL, LDLDIRECT in the last 72 hours. Thyroid Function Tests: No results for input(s): TSH, T4TOTAL, FREET4, T3FREE, THYROIDAB in the last 72 hours. Anemia Panel: No results for input(s): VITAMINB12, FOLATE, FERRITIN, TIBC, IRON, RETICCTPCT in the last 72 hours. Urine analysis:    Component Value Date/Time   COLORURINE YELLOW (A) 12/18/2023 2140   APPEARANCEUR CLEAR (A) 12/18/2023 2140   LABSPEC 1.020 12/18/2023 2140   PHURINE 5.0 12/18/2023 2140   GLUCOSEU NEGATIVE 12/18/2023 2140   HGBUR NEGATIVE 12/18/2023 2140   BILIRUBINUR NEGATIVE 12/18/2023 2140   KETONESUR NEGATIVE 12/18/2023 2140   PROTEINUR NEGATIVE 12/18/2023 2140   NITRITE NEGATIVE 12/18/2023 2140   LEUKOCYTESUR NEGATIVE 12/18/2023 2140    Radiological Exams on Admission: I have personally reviewed images DG Chest Portable 1 View Result Date: 07/25/2024 EXAM: 1 VIEW(S) XRAY OF THE CHEST 07/25/2024 03:44:36 AM COMPARISON: 05/29/2022 CLINICAL HISTORY: Short of breath, new oxygen requirement. FINDINGS:  LUNGS AND PLEURA: No focal pulmonary opacity. No pleural effusion. No pneumothorax. HEART AND MEDIASTINUM: No acute abnormality of the cardiac and mediastinal silhouettes. BONES AND SOFT TISSUES: No acute osseous abnormality. IMPRESSION: 1. No active cardiopulmonary disease. Electronically signed by: Franky Crease MD 07/25/2024 03:50 AM EST RP Workstation: [id number]    EKG: My personal interpretation of EKG shows: Normal sinus rhythm, no ST elevation    Assessment/Plan Principal Problem:   COPD with acute exacerbation (HCC) Active Problems:   Tobacco dependence    Assessment and Plan: 64 year old male with history of COPD not on home oxygen, chronic tobacco dependence who came into ED complaining of cough congestion, shortness of breath for the last week.  1.  Acute exacerbation of COPD/acute hypoxemic respiratory failure -Patient has been hypoxemic requiring oxygen. - Patient was given nebulization, IV steroid and oxygen. - He will be given Solu-Medrol  40 mg twice daily, nebulization, doxycycline  for possible bronchitis. - Continue to monitor oxygen to maintain saturation more than 90%. - Will wean off oxygen when able  2.  Chronic ongoing smoking - Extensive counseling was done -  Patient stated that he is trying to quit. - Will give him nicotine  patch  3.  Anxiety - He was given 1 dose of Ativan  1 mg by mouth.    DVT prophylaxis: Lovenox  Code Status: Full Code Family Communication: None Disposition Plan: Home Consults called: None Admission status: Observation, Telemetry bed   Nena Rebel, MD [facility] 07/25/2024, 9:57 AM       [1] No Known Allergies  "

## 2024-07-25 NOTE — ED Triage Notes (Signed)
 Pt to triage via w/c, audible wheezing, use of accessory muscles, pt tripod position; c/o SHOB, hx COPD, +smoker; denies any recent illness; nonprod cough; used inhaler at home without relief

## 2024-07-26 LAB — HIV ANTIBODY (ROUTINE TESTING W REFLEX): HIV Screen 4th Generation wRfx: NONREACTIVE

## 2024-07-26 MED ORDER — GABAPENTIN 300 MG PO CAPS: 300.0000 mg | ORAL_CAPSULE | Freq: Three times a day (TID) | ORAL | Status: DC | PRN

## 2024-07-26 MED ORDER — IPRATROPIUM-ALBUTEROL 0.5-2.5 (3) MG/3ML IN SOLN: 3.0000 mL | Freq: Two times a day (BID) | RESPIRATORY_TRACT | Status: DC

## 2024-07-26 MED ORDER — ARIPIPRAZOLE 2 MG PO TABS
5.0000 mg | ORAL_TABLET | Freq: Every day | ORAL | Status: DC
  Administered 2024-07-26: 5 mg via ORAL
  Filled 2024-07-26: qty 3

## 2024-07-26 MED ORDER — PREDNISONE 20 MG PO TABS: 40.0000 mg | ORAL_TABLET | Freq: Every day | ORAL | 0 refills | Status: AC

## 2024-07-26 MED ORDER — BUPRENORPHINE HCL-NALOXONE HCL 8-2 MG SL SUBL
1.0000 | SUBLINGUAL_TABLET | Freq: Two times a day (BID) | SUBLINGUAL | Status: DC
  Administered 2024-07-26: 1 via SUBLINGUAL
  Filled 2024-07-26: qty 1

## 2024-07-26 MED ORDER — BUPRENORPHINE HCL-NALOXONE HCL 8-2 MG SL SUBL: 1.0000 | SUBLINGUAL_TABLET | Freq: Every day | SUBLINGUAL | Status: DC

## 2024-07-26 MED ORDER — TRAZODONE HCL 50 MG PO TABS: 50.0000 mg | ORAL_TABLET | Freq: Every day | ORAL | Status: DC

## 2024-07-26 MED ORDER — SUBOXONE 8-2 MG SL FILM: 1.0000 | ORAL_FILM | Freq: Two times a day (BID) | SUBLINGUAL | 0 refills | Status: AC

## 2024-07-26 MED ORDER — DOXYCYCLINE HYCLATE 100 MG PO TABS: 100.0000 mg | ORAL_TABLET | Freq: Two times a day (BID) | ORAL | 0 refills | Status: AC

## 2024-07-26 MED ORDER — TRAZODONE HCL 50 MG PO TABS: 50.0000 mg | ORAL_TABLET | Freq: Every evening | ORAL | 0 refills | Status: AC | PRN

## 2024-07-26 MED ORDER — DOXYCYCLINE HYCLATE 100 MG PO TABS
100.0000 mg | ORAL_TABLET | Freq: Two times a day (BID) | ORAL | Status: DC
  Administered 2024-07-26: 100 mg via ORAL
  Filled 2024-07-26: qty 1

## 2024-07-26 MED ORDER — NICOTINE 14 MG/24HR TD PT24: 14.0000 mg | MEDICATED_PATCH | Freq: Every day | TRANSDERMAL | 0 refills | Status: AC

## 2024-07-26 NOTE — Progress Notes (Signed)
 Discharge criteria med.

## 2024-07-26 NOTE — Progress Notes (Signed)
 PHARMACIST - PHYSICIAN COMMUNICATION DR:   Marsa CONCERNING: Antibiotic IV to Oral Route Change Policy  RECOMMENDATION: This patient is receiving doxycycline  by the intravenous route.  Based on criteria approved by the Pharmacy and Therapeutics Committee, the antibiotic(s) is/are being converted to the equivalent oral dose form(s).   DESCRIPTION: These criteria include: Patient being treated for a respiratory tract infection, urinary tract infection, cellulitis or clostridium difficile associated diarrhea if on metronidazole The patient is not neutropenic and does not exhibit a GI malabsorption state The patient is eating (either orally or via tube) and/or has been taking other orally administered medications for a least 24 hours The patient is improving clinically and has a Tmax < 100.5  If you have questions about this conversion, please contact the Pharmacy Department  []   [phone] )  Roy Diaz [x]   [phone] )  [facility] []   [phone] )  Roy Diaz []   [phone] )  [facility] []   [phone] )  [facility]    Roy Diaz 07/26/2024 8:50 AM

## 2024-07-26 NOTE — Discharge Summary (Signed)
 "    Physician Discharge Summary   Patient: Roy Diaz MRN: [id number]  DOB: Sep 19, 1960   Admit:     Date of Admission: 07/25/2024 Admitted from: home   Discharge: Date of discharge: 07/26/2024 Disposition: Home Condition at discharge: good  CODE STATUS: FULL CODE     Discharge Physician: Laneta Blunt, DO [facility]     PCP: Patient, No Pcp Per  Recommendations for Outpatient Follow-up:  Follow up with PCP 1-2 weeks    Discharge Instructions     Increase activity slowly   Complete by: As directed          Discharge Diagnoses: Principal Problem:   COPD with acute exacerbation (HCC) Active Problems:   Tobacco dependence      Hospital course / significant events:   Abdul Beirne is a pleasant 64 y.o. male with medical history significant for COPD not on home oxygen, chronic ongoing smoking, suboxone  use, who came into ED complaining of cough congestion and wheezing   HPI: cough congestion and wheezing for a week- gradually getting worsening shortness of breath with cough no sputum production, denies any lower extremity swelling, orthopnea or PND.  He does not have a history of CHF DVT or PE.  Denies any recent sick contacts.  Denies any fever, chills, nausea, vomiting, chest pain, palpitations.   01/06: Upon arrival to the ED, patient is found to be hypoxic at 85% on room air, tachypneic at 36, COVID RSV and flu were negative.  Chest x-ray showed no acute cardiopulmonary disease, EKG was sinus rhythm.  Patient was given nebulization, steroid and oxygen.  Admitted to hospitalist for COPD exacerbation. 01/07: improved, pt requests for discharge, doing well on room air and stable for dc home        Consultants:  none  Procedures/Surgeries: none      ASSESSMENT & PLAN:    Acute exacerbation of COPD acute hypoxemic respiratory failure - resolved Refilled inhalers Steroids Abx  Follow w/ PCP   Tobacco abuse Nicotine   rx   Anxiety Follow outpatient   Suboxone  use 5 days rx provided on discharge to hold him to his follow up      overweight based on BMI: Body mass index is 26.83 kg/m.[name] Significantly low or high BMI is associated with higher medical risk.  Underweight - under 18  overweight - 25 to 29 obese - 30 or more Class 1 obesity: BMI of 30.0 to 34 Class 2 obesity: BMI of 35.0 to 39 Class 3 obesity: BMI of 40.0 to 49 Super Morbid Obesity: BMI 50-59 Super-super Morbid Obesity: BMI 60+ Healthy nutrition and physical activity advised as adjunct to other disease management and risk reduction treatments         Discharge Instructions  Allergies as of 07/26/2024   No Known Allergies      Medication List     TAKE these medications    Abilify  5 MG tablet Generic drug: ARIPiprazole  Take 5 mg by mouth daily.   albuterol  108 (90 Base) MCG/ACT inhaler Commonly known as: VENTOLIN  HFA Inhale 2 puffs into the lungs every 4 (four) hours as needed.   doxycycline  100 MG tablet Commonly known as: VIBRA -TABS Take 1 tablet (100 mg total) by mouth every 12 (twelve) hours for 5 days.   gabapentin  300 MG capsule Commonly known as: Neurontin  Take 1 capsule (300 mg total) by mouth 3 (three) times daily as needed.   nicotine  14 mg/24hr patch Commonly known as: NICODERM CQ  - dosed  in mg/24 hours Place 1 patch (14 mg total) onto the skin daily. Start taking on: July 27, 2024   predniSONE  20 MG tablet Commonly known as: DELTASONE  Take 2 tablets (40 mg total) by mouth daily with breakfast. Start taking on: July 27, 2024   Suboxone  8-2 MG Film Generic drug: Buprenorphine  HCl-Naloxone  HCl Place 1 Film under the tongue 2 (two) times daily for 8 days.   traZODone  50 MG tablet Commonly known as: DESYREL  Take 1 tablet (50 mg total) by mouth at bedtime as needed for sleep. What changed:  when to take this reasons to take this          Allergies[1]   Subjective: pt states  feelin gwell, still come cough, ambulating through halls on room air, no other concerns   Discharge Exam: BP 119/66   Pulse 65   Temp 97.7 F (36.5 C)   Resp 17   Ht 5' 7 (1.702 m)   Wt 77.7 kg   SpO2 91%   BMI 26.83 kg/m  General: Pt is alert, awake, not in acute distress Cardiovascular: RRR, S1/S2 +, no rubs, no gallops Respiratory: CTA bilaterally, no wheezing, no rhonchi Abdominal: Soft, NT, ND, bowel sounds + Extremities: no edema, no cyanosis     The results of significant diagnostics from this hospitalization (including imaging, microbiology, ancillary and laboratory) are listed below for reference.     Microbiology: Recent Results (from the past 240 hours)  Resp panel by RT-PCR (RSV, Flu A&B, Covid) Anterior Nasal Swab     Status: None   Collection Time: 07/25/24  3:09 AM   Specimen: Anterior Nasal Swab  Result Value Ref Range Status   SARS Coronavirus 2 by RT PCR NEGATIVE NEGATIVE Final    Comment: (NOTE) SARS-CoV-2 target nucleic acids are NOT DETECTED.  The SARS-CoV-2 RNA is generally detectable in upper respiratory specimens during the acute phase of infection. The lowest concentration of SARS-CoV-2 viral copies this assay can detect is 138 copies/mL. A negative result does not preclude SARS-Cov-2 infection and should not be used as the sole basis for treatment or other patient management decisions. A negative result may occur with  improper specimen collection/handling, submission of specimen other than nasopharyngeal swab, presence of viral mutation(s) within the areas targeted by this assay, and inadequate number of viral copies(<138 copies/mL). A negative result must be combined with clinical observations, patient history, and epidemiological information. The expected result is Negative.  Fact Sheet for Patients:  bloggercourse.com  Fact Sheet for Healthcare Providers:  seriousbroker.it  This test  is no t yet approved or cleared by the [place]  FDA and  has been authorized for detection and/or diagnosis of SARS-CoV-2 by FDA under an Emergency Use Authorization (EUA). This EUA will remain  in effect (meaning this test can be used) for the duration of the COVID-19 declaration under Section 564(b)(1) of the Act, 21 U.S.C.section 360bbb-3(b)(1), unless the authorization is terminated  or revoked sooner.       Influenza A by PCR NEGATIVE NEGATIVE Final   Influenza B by PCR NEGATIVE NEGATIVE Final    Comment: (NOTE) The Xpert Xpress SARS-CoV-2/FLU/RSV plus assay is intended as an aid in the diagnosis of influenza from Nasopharyngeal swab specimens and should not be used as a sole basis for treatment. Nasal washings and aspirates are unacceptable for Xpert Xpress SARS-CoV-2/FLU/RSV testing.  Fact Sheet for Patients: bloggercourse.com  Fact Sheet for Healthcare Providers: seriousbroker.it  This test is not yet approved or cleared by the  [place]  FDA and has been authorized for detection and/or diagnosis of SARS-CoV-2 by FDA under an Emergency Use Authorization (EUA). This EUA will remain in effect (meaning this test can be used) for the duration of the COVID-19 declaration under Section 564(b)(1) of the Act, 21 U.S.C. section 360bbb-3(b)(1), unless the authorization is terminated or revoked.     Resp Syncytial Virus by PCR NEGATIVE NEGATIVE Final    Comment: (NOTE) Fact Sheet for Patients: bloggercourse.com  Fact Sheet for Healthcare Providers: seriousbroker.it  This test is not yet approved or cleared by the [place]  FDA and has been authorized for detection and/or diagnosis of SARS-CoV-2 by FDA under an Emergency Use Authorization (EUA). This EUA will remain in effect (meaning this test can be used) for the duration of the COVID-19 declaration under Section  564(b)(1) of the Act, 21 U.S.C. section 360bbb-3(b)(1), unless the authorization is terminated or revoked.  Performed at [facility], [address] [place], [place] [address]      Labs: BNP (last 3 results) No results for input(s): BNP in the last 8760 hours. Basic Metabolic Panel: Recent Labs  Lab 07/25/24 0303  NA 141  K 4.1  CL 103  CO2 26  GLUCOSE 113*  BUN 13  CREATININE 1.05  CALCIUM 9.8   Liver Function Tests: No results for input(s): AST, ALT, ALKPHOS, BILITOT, PROT, ALBUMIN in the last 168 hours. No results for input(s): LIPASE, AMYLASE in the last 168 hours. No results for input(s): AMMONIA in the last 168 hours. CBC: Recent Labs  Lab 07/25/24 0303  WBC 7.2  NEUTROABS 4.0  HGB 17.6*  HCT 52.1*  MCV 93.4  PLT 280   Cardiac Enzymes: No results for input(s): CKTOTAL, CKMB, CKMBINDEX, TROPONINI in the last 168 hours. BNP: Invalid input(s): POCBNP CBG: No results for input(s): GLUCAP in the last 168 hours. D-Dimer No results for input(s): DDIMER in the last 72 hours. Hgb A1c No results for input(s): HGBA1C in the last 72 hours. Lipid Profile No results for input(s): CHOL, HDL, LDLCALC, TRIG, CHOLHDL, LDLDIRECT in the last 72 hours. Thyroid function studies No results for input(s): TSH, T4TOTAL, T3FREE, THYROIDAB in the last 72 hours.  Invalid input(s): FREET3 Anemia work up No results for input(s): VITAMINB12, FOLATE, FERRITIN, TIBC, IRON, RETICCTPCT in the last 72 hours. Urinalysis    Component Value Date/Time   COLORURINE YELLOW (A) 12/18/2023 2140   APPEARANCEUR CLEAR (A) 12/18/2023 2140   LABSPEC 1.020 12/18/2023 2140   PHURINE 5.0 12/18/2023 2140   GLUCOSEU NEGATIVE 12/18/2023 2140   HGBUR NEGATIVE 12/18/2023 2140   BILIRUBINUR NEGATIVE 12/18/2023 2140   KETONESUR NEGATIVE 12/18/2023 2140   PROTEINUR NEGATIVE 12/18/2023 2140   NITRITE NEGATIVE 12/18/2023  2140   LEUKOCYTESUR NEGATIVE 12/18/2023 2140   Sepsis Labs Recent Labs  Lab 07/25/24 0303  WBC 7.2   Microbiology Recent Results (from the past 240 hours)  Resp panel by RT-PCR (RSV, Flu A&B, Covid) Anterior Nasal Swab     Status: None   Collection Time: 07/25/24  3:09 AM   Specimen: Anterior Nasal Swab  Result Value Ref Range Status   SARS Coronavirus 2 by RT PCR NEGATIVE NEGATIVE Final    Comment: (NOTE) SARS-CoV-2 target nucleic acids are NOT DETECTED.  The SARS-CoV-2 RNA is generally detectable in upper respiratory specimens during the acute phase of infection. The lowest concentration of SARS-CoV-2 viral copies this assay can detect is 138 copies/mL. A negative result does not preclude SARS-Cov-2 infection and should not be  used as the sole basis for treatment or other patient management decisions. A negative result may occur with  improper specimen collection/handling, submission of specimen other than nasopharyngeal swab, presence of viral mutation(s) within the areas targeted by this assay, and inadequate number of viral copies(<138 copies/mL). A negative result must be combined with clinical observations, patient history, and epidemiological information. The expected result is Negative.  Fact Sheet for Patients:  bloggercourse.com  Fact Sheet for Healthcare Providers:  seriousbroker.it  This test is no t yet approved or cleared by the [place]  FDA and  has been authorized for detection and/or diagnosis of SARS-CoV-2 by FDA under an Emergency Use Authorization (EUA). This EUA will remain  in effect (meaning this test can be used) for the duration of the COVID-19 declaration under Section 564(b)(1) of the Act, 21 U.S.C.section 360bbb-3(b)(1), unless the authorization is terminated  or revoked sooner.       Influenza A by PCR NEGATIVE NEGATIVE Final   Influenza B by PCR NEGATIVE NEGATIVE Final    Comment:  (NOTE) The Xpert Xpress SARS-CoV-2/FLU/RSV plus assay is intended as an aid in the diagnosis of influenza from Nasopharyngeal swab specimens and should not be used as a sole basis for treatment. Nasal washings and aspirates are unacceptable for Xpert Xpress SARS-CoV-2/FLU/RSV testing.  Fact Sheet for Patients: bloggercourse.com  Fact Sheet for Healthcare Providers: seriousbroker.it  This test is not yet approved or cleared by the [place]  FDA and has been authorized for detection and/or diagnosis of SARS-CoV-2 by FDA under an Emergency Use Authorization (EUA). This EUA will remain in effect (meaning this test can be used) for the duration of the COVID-19 declaration under Section 564(b)(1) of the Act, 21 U.S.C. section 360bbb-3(b)(1), unless the authorization is terminated or revoked.     Resp Syncytial Virus by PCR NEGATIVE NEGATIVE Final    Comment: (NOTE) Fact Sheet for Patients: bloggercourse.com  Fact Sheet for Healthcare Providers: seriousbroker.it  This test is not yet approved or cleared by the [place]  FDA and has been authorized for detection and/or diagnosis of SARS-CoV-2 by FDA under an Emergency Use Authorization (EUA). This EUA will remain in effect (meaning this test can be used) for the duration of the COVID-19 declaration under Section 564(b)(1) of the Act, 21 U.S.C. section 360bbb-3(b)(1), unless the authorization is terminated or revoked.  Performed at [facility], [address] [place], [place] [address]    Imaging DG Chest Portable 1 View Result Date: 07/25/2024 EXAM: 1 VIEW(S) XRAY OF THE CHEST 07/25/2024 03:44:36 AM COMPARISON: 05/29/2022 CLINICAL HISTORY: Short of breath, new oxygen requirement. FINDINGS: LUNGS AND PLEURA: No focal pulmonary opacity. No pleural effusion. No pneumothorax. HEART AND MEDIASTINUM: No acute abnormality  of the cardiac and mediastinal silhouettes. BONES AND SOFT TISSUES: No acute osseous abnormality. IMPRESSION: 1. No active cardiopulmonary disease. Electronically signed by: Franky Crease MD 07/25/2024 03:50 AM EST RP Workstation: [id number]      Time coordinating discharge: over 30 minutes  SIGNED:  Araceli Coufal DO [facility]       [1] No Known Allergies  "

## 2024-07-26 NOTE — Plan of Care (Signed)
" °  Problem: Health Behavior/Discharge Planning: Goal: Ability to manage health-related needs will improve Outcome: Completed/Met   Problem: Clinical Measurements: Goal: Respiratory complications will improve Outcome: Completed/Met   Problem: Safety: Goal: Ability to remain free from injury will improve 07/26/2024 1041 by Claudene Nonnie LABOR, RN Outcome: Completed/Met 07/26/2024 0811 by Claudene Nonnie LABOR, RN Outcome: Progressing   "

## 2024-07-26 NOTE — Hospital Course (Signed)
 Hospital course / significant events:   Roy Diaz is a pleasant 64 y.o. male with medical history significant for COPD not on home oxygen, chronic ongoing smoking, suboxone  use, who came into ED complaining of cough congestion and wheezing   HPI: cough congestion and wheezing for a week- gradually getting worsening shortness of breath with cough no sputum production, denies any lower extremity swelling, orthopnea or PND.  He does not have a history of CHF DVT or PE.  Denies any recent sick contacts.  Denies any fever, chills, nausea, vomiting, chest pain, palpitations.   01/06: Upon arrival to the ED, patient is found to be hypoxic at 85% on room air, tachypneic at 36, COVID RSV and flu were negative.  Chest x-ray showed no acute cardiopulmonary disease, EKG was sinus rhythm.  Patient was given nebulization, steroid and oxygen.  Admitted to hospitalist for COPD exacerbation. 01/07:        Consultants:  ***  Procedures/Surgeries: ***      ASSESSMENT & PLAN:    1.  Acute exacerbation of COPD/acute hypoxemic respiratory failure -Patient has been hypoxemic requiring oxygen. - Patient was given nebulization, IV steroid and oxygen. - He will be given Solu-Medrol  40 mg twice daily, nebulization, doxycycline  for possible bronchitis. - Continue to monitor oxygen to maintain saturation more than 90%. - Will wean off oxygen when able   2.  Chronic ongoing smoking - Extensive counseling was done - Patient stated that he is trying to quit. - Will give him nicotine  patch   3.  Anxiety - He was given 1 dose of Ativan  1 mg by mouth.   Suboxone  use Hx opiate ***     *** based on BMI: Body mass index is 26.83 kg/m.Roy Diaz Significantly low or high BMI is associated with higher medical risk.  Underweight - under 18  overweight - 25 to 29 obese - 30 or more Class 1 obesity: BMI of 30.0 to 34 Class 2 obesity: BMI of 35.0 to 39 Class 3 obesity: BMI of 40.0 to 49 Super Morbid  Obesity: BMI 50-59 Super-super Morbid Obesity: BMI 60+ Healthy nutrition and physical activity advised as adjunct to other disease management and risk reduction treatments    DVT prophylaxis: *** IV fluids: *** continuous IV fluids  Nutrition: *** Central lines / other devices: ***  Code Status: *** ACP documentation reviewed: *** none on file in [facility]  TOC needs: *** Medical barriers to dispo: ***. Expected medical readiness for discharge ***.
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
## 2024-07-26 NOTE — Progress Notes (Signed)
 Entered pt room to give him some cough medication and observed an empty medicine bottle on the bedside table that was not there earlier. Questioned pt about the bottle and he stated it was his suboxone . Informed pt that he is not suppose to take any of his home medication and that we need to send them to pharmacy. Pt states he does not have any more.

## 2024-07-26 NOTE — Plan of Care (Signed)

## 2024-08-16 ENCOUNTER — Emergency Department: Admission: EM | Admit: 2024-08-16 | Discharge: 2024-08-16 | Disposition: A | Discharge status: Home / Self Care

## 2024-08-16 ENCOUNTER — Other Ambulatory Visit: Payer: Self-pay

## 2024-08-16 ENCOUNTER — Emergency Department

## 2024-08-16 DIAGNOSIS — J441 Chronic obstructive pulmonary disease with (acute) exacerbation: Secondary | ICD-10-CM | POA: Insufficient documentation

## 2024-08-16 DIAGNOSIS — F1110 Opioid abuse, uncomplicated: Secondary | ICD-10-CM | POA: Insufficient documentation

## 2024-08-16 DIAGNOSIS — R051 Acute cough: Secondary | ICD-10-CM

## 2024-08-16 DIAGNOSIS — R059 Cough, unspecified: Secondary | ICD-10-CM | POA: Diagnosis present

## 2024-08-16 LAB — CBC
HCT: 50.5 % (ref 39.0–52.0)
Hemoglobin: 16.7 g/dL (ref 13.0–17.0)
MCH: 31.5 pg (ref 26.0–34.0)
MCHC: 33.1 g/dL (ref 30.0–36.0)
MCV: 95.1 fL (ref 80.0–100.0)
Platelets: 264 10*3/uL (ref 150–400)
RBC: 5.31 MIL/uL (ref 4.22–5.81)
RDW: 13 % (ref 11.5–15.5)
WBC: 7.1 10*3/uL (ref 4.0–10.5)
nRBC: 0 % (ref 0.0–0.2)

## 2024-08-16 LAB — BASIC METABOLIC PANEL WITH GFR
Anion gap: 8 (ref 5–15)
BUN: 14 mg/dL (ref 8–23)
CO2: 28 mmol/L (ref 22–32)
Calcium: 9.2 mg/dL (ref 8.9–10.3)
Chloride: 102 mmol/L (ref 98–111)
Creatinine, Ser: 1.11 mg/dL (ref 0.61–1.24)
GFR, Estimated: >60 mL/min
Glucose, Bld: 143 mg/dL — ABNORMAL HIGH (ref 70–99)
Potassium: 4.7 mmol/L (ref 3.5–5.1)
Sodium: 138 mmol/L (ref 135–145)

## 2024-08-16 LAB — TROPONIN T, HIGH SENSITIVITY: Troponin T High Sensitivity: 13 ng/L (ref 0–19)

## 2024-08-16 MED ORDER — BUPRENORPHINE HCL-NALOXONE HCL 8-2 MG SL SUBL
1.0000 | SUBLINGUAL_TABLET | Freq: Once | SUBLINGUAL | Status: DC
  Filled 2024-08-16: qty 1

## 2024-08-16 MED ORDER — IPRATROPIUM-ALBUTEROL 0.5-2.5 (3) MG/3ML IN SOLN: 3.0000 mL | Freq: Once | RESPIRATORY_TRACT | Status: DC

## 2024-08-16 NOTE — ED Provider Notes (Signed)
 "  [facility] Provider Note    Event Date/Time   First MD Initiated Contact with Patient 08/16/24 [id number]     (approximate)   History   Cough and Shortness of Breath   HPI  Roy Diaz is a 64 y.o. male who presents to the emergency department today with request for Suboxone .  Patient reports that he had been taking 7 kratom which he got at the smoke and vape shops, but did not realize that it was addictive.  He reports that he was taking 2 packs of these daily for the past 2 months, and then realized that this was addictive and was able to be referred to a Suboxone  clinic in [place].  However, he reports that he was admitted to the hospital earlier this month, where his Suboxone  was continued.  He reports that they discharged him on Suboxone , and patient took his last dose yesterday.  He is here requesting more Suboxone  until he is able to follow-up at his new Suboxone  clinic which starts this week in [place].  The triage note reports that patient was complaining of cough and shortness of breath.  However, when I asked patient about this, he reports that he does not have cough or shortness of breath.  He reports that he used his inhaler last night as he does daily, and this took care of that problem.  He did not have any chest pain.  He does not have chest pain currently.  He does not feel short of breath currently.  He does not wish to have any breathing treatments or medications besides Suboxone  today.     Physical Exam   Triage Vital Signs: ED Triage Vitals  Encounter Vitals Group     BP 08/16/24 0732 (!) 144/85     Girls Systolic BP Percentile --      Girls Diastolic BP Percentile --      Boys Systolic BP Percentile --      Boys Diastolic BP Percentile --      Pulse Rate 08/16/24 0730 71     Resp 08/16/24 0730 18     Temp 08/16/24 0730 97.8 F (36.6 C)     Temp Source 08/16/24 0730 Oral     SpO2 08/16/24 0730 94 %     Weight --      Height  --      Head Circumference --      Peak Flow --      Pain Score 08/16/24 0731 0     Pain Loc --      Pain Education --      Exclude from Growth Chart --     Most recent vital signs: Vitals:   08/16/24 0732 08/16/24 0925  BP: (!) 144/85 119/76  Pulse:    Resp:    Temp:    SpO2:  92%    Physical Exam Vitals and nursing note reviewed.  Constitutional:      General: Awake and alert. No acute distress.    Appearance: Normal appearance. The patient is normal weight.  HENT:     Head: Normocephalic and atraumatic.     Mouth: Mucous membranes are moist.  Eyes:     General: PERRL. Normal EOMs        Right eye: No discharge.        Left eye: No discharge.     Conjunctiva/sclera: Conjunctivae normal.  Cardiovascular:     Rate and Rhythm: Normal rate and regular  rhythm.     Pulses: Normal pulses.  Pulmonary:     Effort: Pulmonary effort is normal. No respiratory distress.     Breath sounds: Normal breath sounds.  Abdominal:     Abdomen is soft. There is no abdominal tenderness. No rebound or guarding. No distention. Musculoskeletal:        General: No swelling. Normal range of motion.     Cervical back: Normal range of motion and neck supple.  Skin:    General: Skin is warm and dry.     Capillary Refill: Capillary refill takes less than 2 seconds.     Findings: No rash.  Neurological:     Mental Status: The patient is awake and alert.      ED Results / Procedures / Treatments   Labs (all labs ordered are listed, but only abnormal results are displayed) Labs Reviewed  BASIC METABOLIC PANEL WITH GFR - Abnormal; Notable for the following components:      Result Value   Glucose, Bld 143 (*)    All other components within normal limits  CBC  TROPONIN T, HIGH SENSITIVITY  TROPONIN T, HIGH SENSITIVITY     EKG     RADIOLOGY I independently reviewed and interpreted imaging and agree with radiologists findings.     PROCEDURES:  Critical Care performed:    Procedures   MEDICATIONS ORDERED IN ED: Medications  buprenorphine -naloxone  (SUBOXONE ) 8-2 mg per SL tablet 1 tablet (1 tablet Sublingual Patient Refused/Not Given 08/16/24 0925)     IMPRESSION / MDM / ASSESSMENT AND PLAN / ED COURSE  I reviewed the triage vital signs and the nursing notes.   Differential diagnosis includes, but is not limited to, COPD exacerbation, opiate use disorder, opiate withdrawal, viral URI  Patient is awake and alert, hemodynamically stable and afebrile.  I reviewed the patient's chart.  He had a recent admission for COPD exacerbation, and he was indeed discharged on Suboxone .  Patient does have expiratory wheezes bilaterally.  His labs that were obtained in triage are overall reassuring.  Chest x-ray does not reveal acute cardiopulmonary abnormality.  I recommended breathing treatments and medications to help with his symptoms, however patient declined.  He is only requesting Suboxone .  No chest pain or pleurisy to suggest acute coronary syndrome or pulmonary embolism.  Furthermore he has no lower extremity pain or swelling, no history of PE or DVT, no exogenous hormone use to suggest pulmonary embolism.  He does have an oxygen saturation of 94% on room air on arrival to the emergency department we discussed this possibility and I recommended further workup for this, however he declined.  It is more likely that his oxygen saturation is due to his COPD, and I recommended medications to help with his COPD, however patient declined.  He reports that he feels his normal self and does not want to have any further workup or medications today except for Suboxone .  I discussed with pharmacist who reports that it is okay to give him Suboxone  in the emergency department.  I had ordered him his dose of Suboxone , however when this arrived, patient reports that he wanted the strip and not the pill, and left the emergency department reporting that he will just follow-up in his  Suboxone  clinic.  We discussed return precautions in the meantime.  Patient understands and agrees with plan.  Discharged in stable condition.   Patient's presentation is most consistent with acute complicated illness / injury requiring diagnostic workup.   Clinical Course  as of 08/16/24 1312  Wed Aug 16, 2024  0847 Discussed with pharmacist Lum, okay to give Suboxone  here [JP]  0850 Patient changes mind, declined duoneb [JP]    Clinical Course User Index [JP] Jhan Conery E, PA-C     FINAL CLINICAL IMPRESSION(S) / ED DIAGNOSES   Final diagnoses:  Acute cough  Chronic use of nonprescription opiate drugs (HCC)  COPD exacerbation (HCC)     Rx / DC Orders   ED Discharge Orders     None        Note:  This document was prepared using Dragon voice recognition software and may include unintentional dictation errors.   Tanuj Mullens E, PA-C 08/16/24 1312    Fernand Rossie HERO, MD 08/16/24 1536  "

## 2024-08-16 NOTE — ED Triage Notes (Addendum)
 Pt to ED via POV from home. Pt reports cough, congestion and SOB that started last pm. Hx of COPD. Pt reports quit smoking yesterday. Pt reports used inhaler multiple times with some relief. Denies CP, N/V/D or fever.

## 2024-08-16 NOTE — Discharge Instructions (Signed)
 Please follow-up in the Suboxone  clinic next week as you have planned.  You did not wish to have any breathing treatments today.  Please return for any new, worsening, or change in symptoms or other concerns.  It was a pleasure caring for you today

## 2024-09-04 ENCOUNTER — Ambulatory Visit: Admitting: Student in an Organized Health Care Education/Training Program
